# Patient Record
Sex: Male | Born: 1980 | Race: Black or African American | Hispanic: No | Marital: Single | Smoking: Never smoker
Health system: Southern US, Community
[De-identification: ages and names within clinical notes are randomized; demographics above are authoritative.]

## PROBLEM LIST (undated history)

## (undated) DIAGNOSIS — W3400XA Accidental discharge from unspecified firearms or gun, initial encounter: Secondary | ICD-10-CM

## (undated) HISTORY — PX: OTHER SURGICAL HISTORY: SHX169

## (undated) HISTORY — PX: ABDOMINAL SURGERY: SHX537

---

## 2015-03-22 ENCOUNTER — Emergency Department (HOSPITAL_BASED_OUTPATIENT_CLINIC_OR_DEPARTMENT_OTHER)
Admission: EM | Admit: 2015-03-22 | Discharge: 2015-03-22 | Disposition: A | Discharge status: Home / Self Care | Payer: Self-pay | Attending: Emergency Medicine | Admitting: Emergency Medicine

## 2015-03-22 ENCOUNTER — Emergency Department (HOSPITAL_BASED_OUTPATIENT_CLINIC_OR_DEPARTMENT_OTHER): Payer: Self-pay

## 2015-03-22 ENCOUNTER — Encounter (HOSPITAL_BASED_OUTPATIENT_CLINIC_OR_DEPARTMENT_OTHER): Payer: Self-pay

## 2015-03-22 DIAGNOSIS — Y998 Other external cause status: Secondary | ICD-10-CM | POA: Insufficient documentation

## 2015-03-22 DIAGNOSIS — X58XXXA Exposure to other specified factors, initial encounter: Secondary | ICD-10-CM | POA: Insufficient documentation

## 2015-03-22 DIAGNOSIS — M545 Low back pain, unspecified: Secondary | ICD-10-CM

## 2015-03-22 DIAGNOSIS — S3992XA Unspecified injury of lower back, initial encounter: Secondary | ICD-10-CM | POA: Insufficient documentation

## 2015-03-22 DIAGNOSIS — Y9389 Activity, other specified: Secondary | ICD-10-CM | POA: Insufficient documentation

## 2015-03-22 DIAGNOSIS — Y9289 Other specified places as the place of occurrence of the external cause: Secondary | ICD-10-CM | POA: Insufficient documentation

## 2015-03-22 HISTORY — DX: Accidental discharge from unspecified firearms or gun, initial encounter: W34.00XA

## 2015-03-22 MED ORDER — CYCLOBENZAPRINE HCL 10 MG PO TABS: 10.0000 mg | ORAL_TABLET | Freq: Three times a day (TID) | ORAL | Status: DC | PRN
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
## 2015-03-22 NOTE — ED Notes (Signed)
Patient transported to X-ray 

## 2015-03-22 NOTE — Discharge Instructions (Signed)

## 2016-04-13 ENCOUNTER — Emergency Department (HOSPITAL_BASED_OUTPATIENT_CLINIC_OR_DEPARTMENT_OTHER)
Admission: EM | Admit: 2016-04-13 | Discharge: 2016-04-13 | Disposition: A | Discharge status: Home / Self Care | Payer: Self-pay | Attending: Emergency Medicine | Admitting: Emergency Medicine

## 2016-04-13 ENCOUNTER — Encounter (HOSPITAL_BASED_OUTPATIENT_CLINIC_OR_DEPARTMENT_OTHER): Payer: Self-pay | Admitting: *Deleted

## 2016-04-13 DIAGNOSIS — J020 Streptococcal pharyngitis: Secondary | ICD-10-CM | POA: Insufficient documentation

## 2016-04-13 LAB — RAPID STREP SCREEN (MED CTR MEBANE ONLY): Streptococcus, Group A Screen (Direct): POSITIVE — AB

## 2016-04-13 MED ORDER — PENICILLIN G BENZATHINE 1200000 UNIT/2ML IM SUSP
1.2000 10*6.[IU] | Freq: Once | INTRAMUSCULAR | Status: AC
  Administered 2016-04-13: 1.2 10*6.[IU] via INTRAMUSCULAR
  Filled 2016-04-13: qty 2

## 2016-04-13 NOTE — Discharge Instructions (Signed)
Discard your toothbrush and begin using a new one. For sore throat, take ibuprofen every 6 hours as needed. Follow up with your doctor in 2-3 days if no improvement. Return to the ED sooner for worsening condition, inability to swallow, breathing difficulty, new concerns.

## 2016-04-13 NOTE — ED Triage Notes (Signed)
Pt reports earlier this week had a URI, states that his throat is hurting now.  Denies fever.
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
## 2018-08-09 ENCOUNTER — Other Ambulatory Visit: Payer: Self-pay

## 2018-08-09 ENCOUNTER — Emergency Department (HOSPITAL_BASED_OUTPATIENT_CLINIC_OR_DEPARTMENT_OTHER): Payer: Self-pay

## 2018-08-09 ENCOUNTER — Emergency Department (HOSPITAL_BASED_OUTPATIENT_CLINIC_OR_DEPARTMENT_OTHER)
Admission: EM | Admit: 2018-08-09 | Discharge: 2018-08-09 | Disposition: A | Discharge status: Home / Self Care | Payer: Self-pay | Attending: Emergency Medicine | Admitting: Emergency Medicine

## 2018-08-09 ENCOUNTER — Encounter (HOSPITAL_BASED_OUTPATIENT_CLINIC_OR_DEPARTMENT_OTHER): Payer: Self-pay | Admitting: Emergency Medicine

## 2018-08-09 DIAGNOSIS — W2209XA Striking against other stationary object, initial encounter: Secondary | ICD-10-CM | POA: Insufficient documentation

## 2018-08-09 DIAGNOSIS — S62396A Other fracture of fifth metacarpal bone, right hand, initial encounter for closed fracture: Secondary | ICD-10-CM | POA: Insufficient documentation

## 2018-08-09 DIAGNOSIS — R52 Pain, unspecified: Secondary | ICD-10-CM

## 2018-08-09 DIAGNOSIS — Y939 Activity, unspecified: Secondary | ICD-10-CM | POA: Insufficient documentation

## 2018-08-09 DIAGNOSIS — Y929 Unspecified place or not applicable: Secondary | ICD-10-CM | POA: Insufficient documentation

## 2018-08-09 DIAGNOSIS — Y999 Unspecified external cause status: Secondary | ICD-10-CM | POA: Insufficient documentation

## 2018-08-09 MED ORDER — LIDOCAINE HCL 2 % IJ SOLN
INTRAMUSCULAR | Status: AC
  Filled 2018-08-09: qty 20

## 2018-08-09 MED ORDER — HYDROCODONE-ACETAMINOPHEN 5-325 MG PO TABS
1.0000 | ORAL_TABLET | Freq: Once | ORAL | Status: AC
  Administered 2018-08-09: 1 via ORAL
  Filled 2018-08-09: qty 1

## 2018-08-09 MED ORDER — OXYCODONE HCL 5 MG PO TABS: 5.0000 mg | ORAL_TABLET | Freq: Four times a day (QID) | ORAL | 0 refills | Status: AC | PRN

## 2018-08-09 MED ORDER — LIDOCAINE HCL (PF) 1 % IJ SOLN
5.0000 mL | Freq: Once | INTRAMUSCULAR | Status: AC
  Administered 2018-08-09: 5 mL
  Filled 2018-08-09: qty 5
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
## 2018-08-09 NOTE — ED Notes (Signed)
Pt verbalized understanding of dc instructions.
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
## 2018-08-09 NOTE — ED Notes (Addendum)
ED Provider at bedside, EMT and XRAY at bedside for reduction. Pt alert

## 2018-08-18 ENCOUNTER — Ambulatory Visit (INDEPENDENT_AMBULATORY_CARE_PROVIDER_SITE_OTHER): Payer: Self-pay

## 2018-08-18 ENCOUNTER — Encounter: Payer: Self-pay | Admitting: Orthopaedic Surgery

## 2018-08-18 ENCOUNTER — Ambulatory Visit (INDEPENDENT_AMBULATORY_CARE_PROVIDER_SITE_OTHER): Payer: Self-pay | Admitting: Orthopaedic Surgery

## 2018-08-18 DIAGNOSIS — S62336A Displaced fracture of neck of fifth metacarpal bone, right hand, initial encounter for closed fracture: Secondary | ICD-10-CM
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
## 2018-09-08 ENCOUNTER — Ambulatory Visit (INDEPENDENT_AMBULATORY_CARE_PROVIDER_SITE_OTHER): Payer: Self-pay | Admitting: Orthopaedic Surgery

## 2018-09-08 ENCOUNTER — Other Ambulatory Visit: Payer: Self-pay

## 2018-09-08 ENCOUNTER — Encounter: Payer: Self-pay | Admitting: Orthopaedic Surgery

## 2018-09-08 ENCOUNTER — Ambulatory Visit (INDEPENDENT_AMBULATORY_CARE_PROVIDER_SITE_OTHER): Payer: Self-pay

## 2018-09-08 VITALS — Ht 72.0 in | Wt 264.0 lb

## 2018-09-08 DIAGNOSIS — S62336D Displaced fracture of neck of fifth metacarpal bone, right hand, subsequent encounter for fracture with routine healing: Secondary | ICD-10-CM
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
## 2019-12-26 IMAGING — CR RIGHT HAND - COMPLETE 3+ VIEW
3 series · 3 of 3 positions shown · non-contrast
Comparison: None.

CLINICAL DATA: Punched a wall yesterday with medial hand pain,
initial encounter

EXAM:
RIGHT HAND - COMPLETE 3+ VIEW

[x hand pa right]
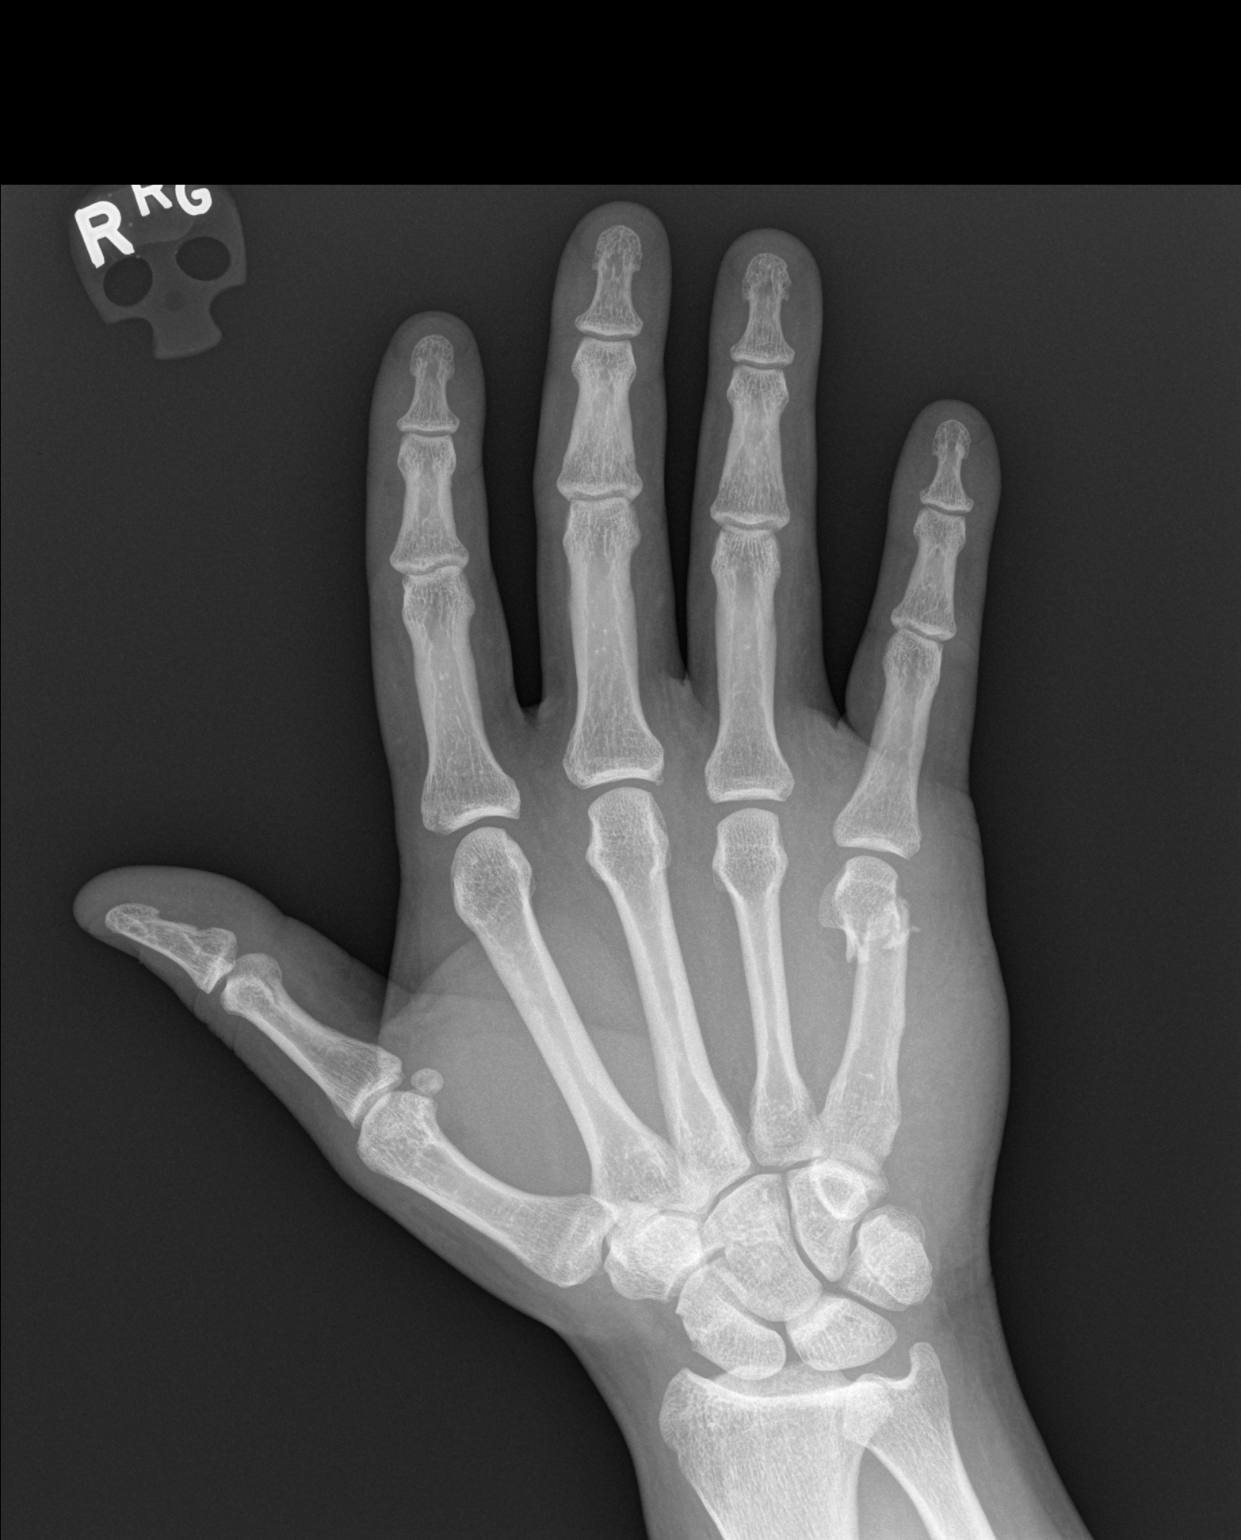

[x hand oblique right]
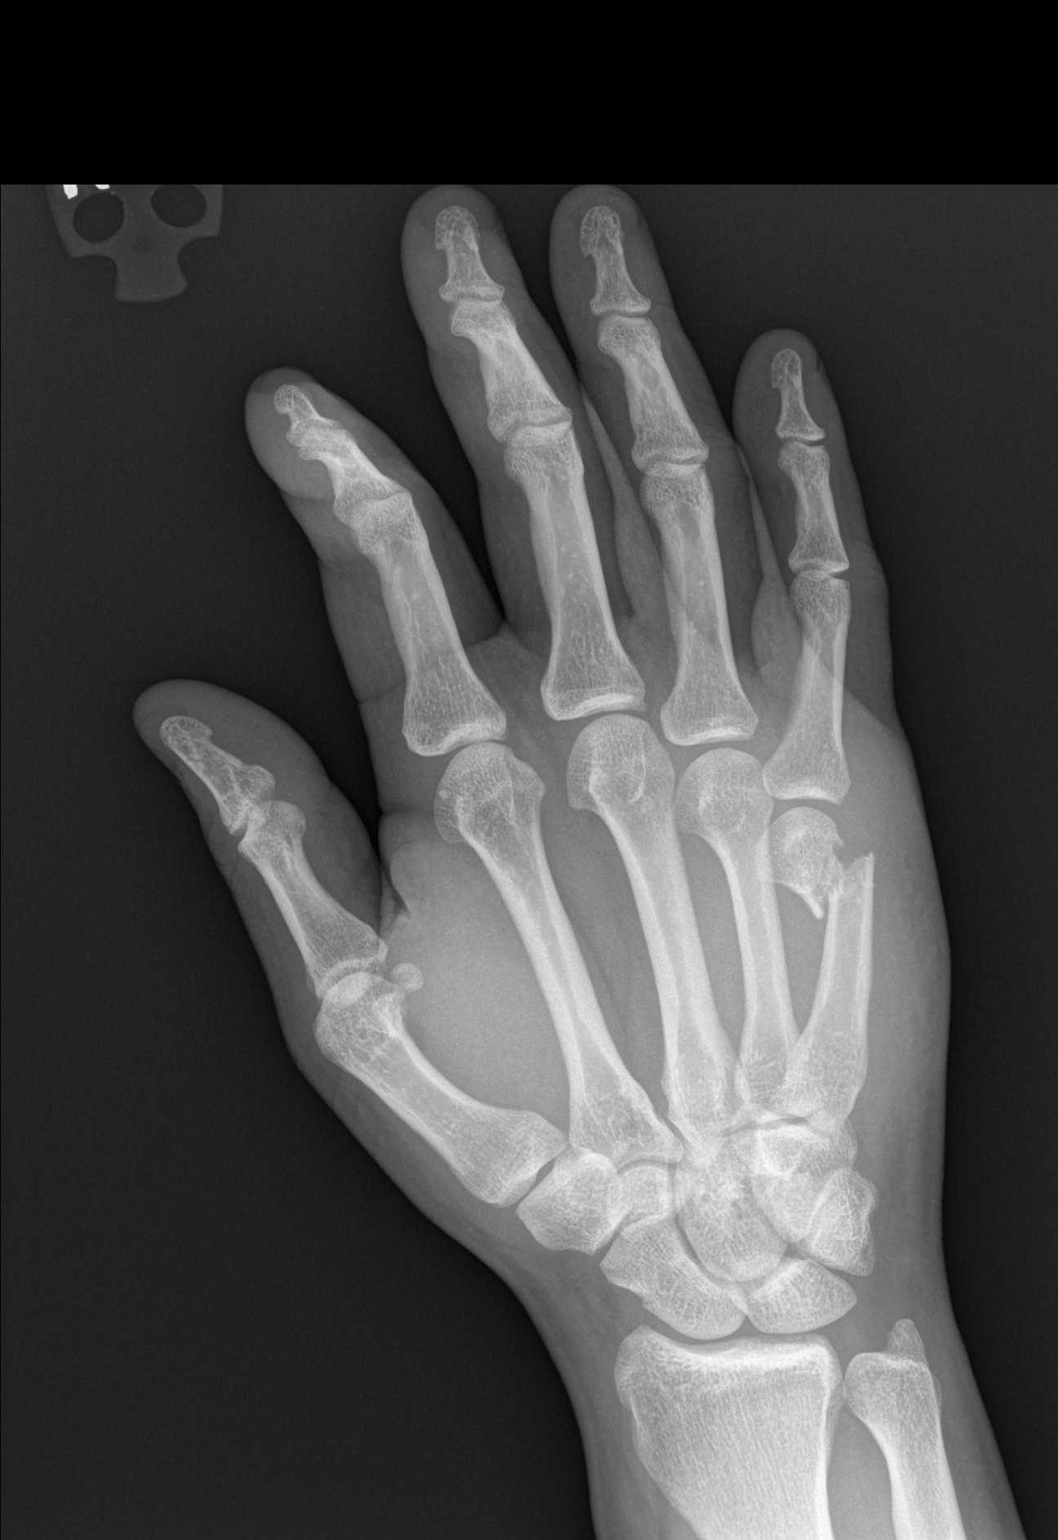

[x hand lat right]
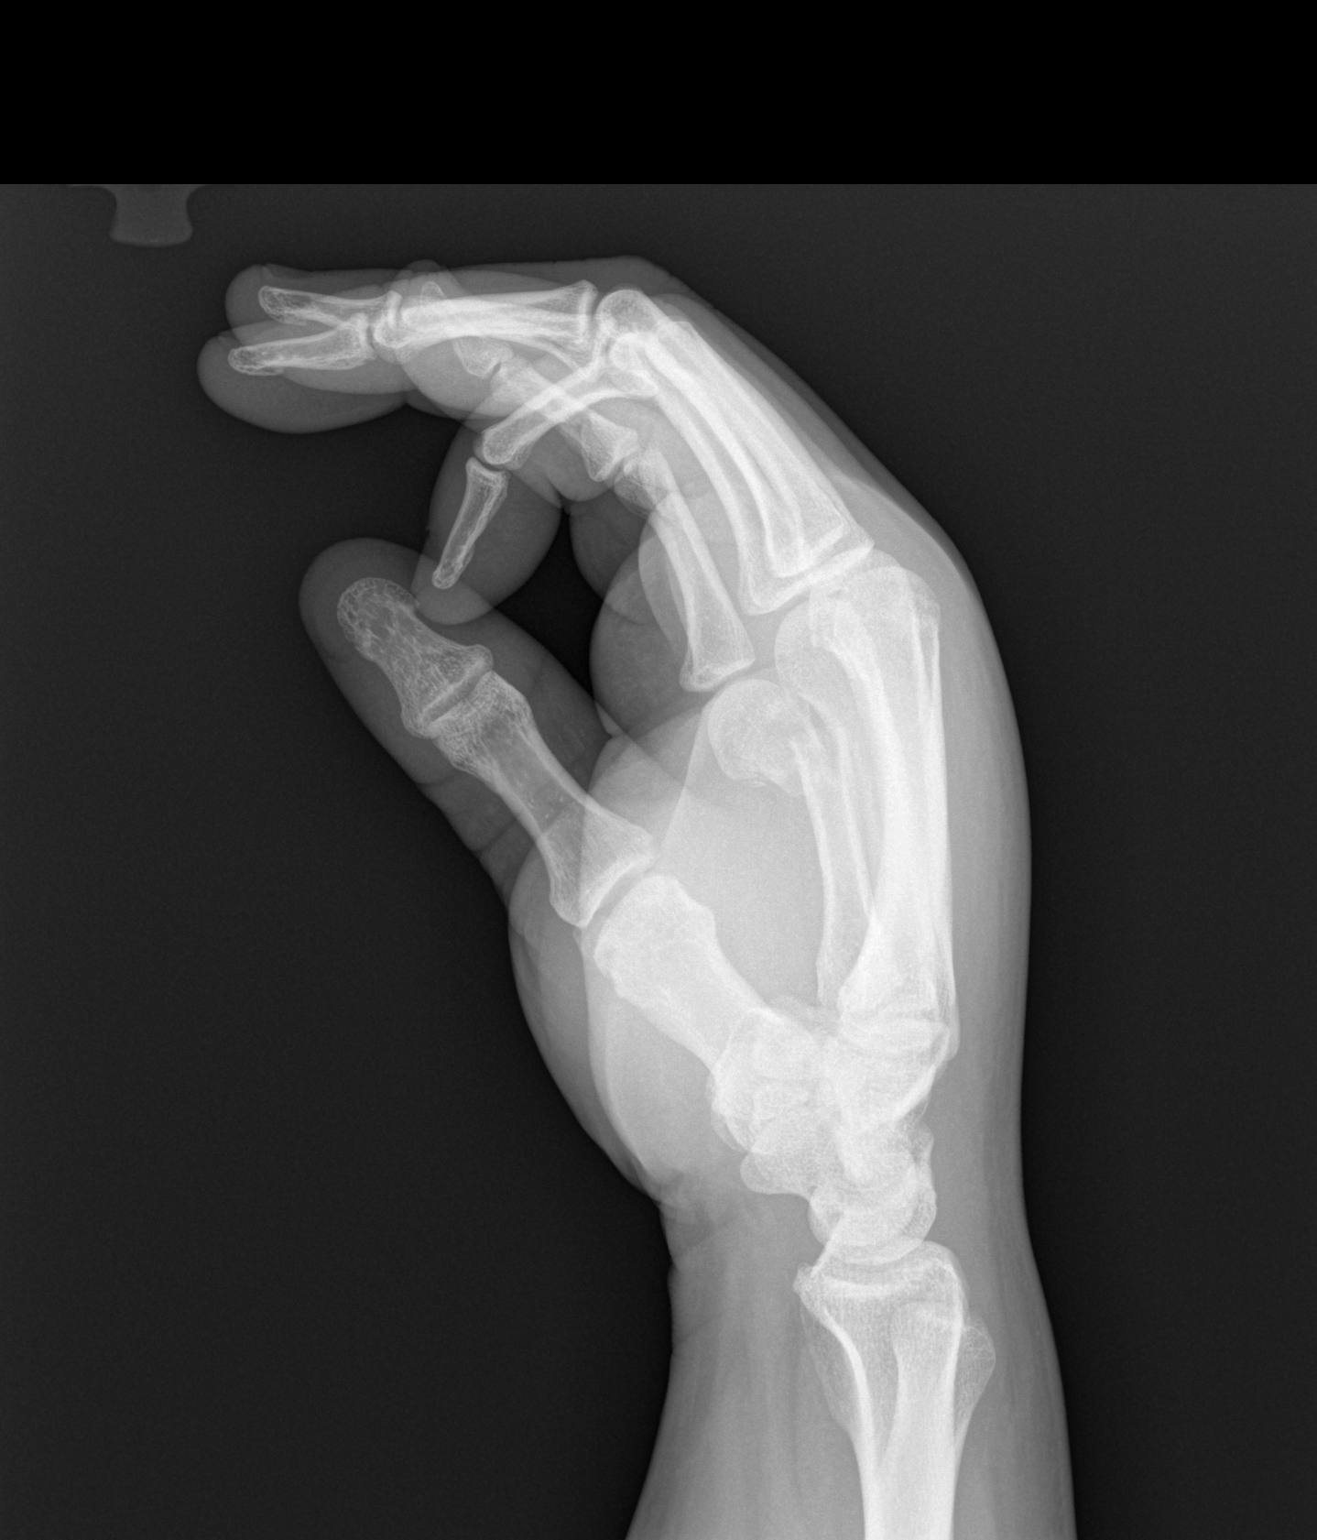

[3 of 3 positions shown; findings below may reference images not displayed]

FINDINGS: Comminuted fracture of the distal fifth metacarpal is noted with
angulation at the fracture site. Mild impaction is noted as well. No
other fractures are seen.
IMPRESSION: Fracture of the distal fifth metacarpal.

## 2021-02-14 ENCOUNTER — Other Ambulatory Visit: Payer: Self-pay

## 2021-02-14 ENCOUNTER — Encounter (HOSPITAL_BASED_OUTPATIENT_CLINIC_OR_DEPARTMENT_OTHER): Payer: Self-pay | Admitting: Urology

## 2021-02-14 ENCOUNTER — Emergency Department (HOSPITAL_BASED_OUTPATIENT_CLINIC_OR_DEPARTMENT_OTHER)
Admission: EM | Admit: 2021-02-14 | Discharge: 2021-02-14 | Disposition: A | Discharge status: Home / Self Care | Payer: Self-pay | Attending: Emergency Medicine | Admitting: Emergency Medicine

## 2021-02-14 DIAGNOSIS — K0889 Other specified disorders of teeth and supporting structures: Secondary | ICD-10-CM

## 2021-02-14 DIAGNOSIS — K047 Periapical abscess without sinus: Secondary | ICD-10-CM | POA: Insufficient documentation

## 2021-02-14 MED ORDER — KETOROLAC TROMETHAMINE 30 MG/ML IJ SOLN
60.0000 mg | Freq: Once | INTRAMUSCULAR | Status: AC
  Administered 2021-02-14: 60 mg via INTRAMUSCULAR
  Filled 2021-02-14: qty 2

## 2021-02-14 MED ORDER — HYDROCODONE-ACETAMINOPHEN 5-325 MG PO TABS
2.0000 | ORAL_TABLET | Freq: Once | ORAL | Status: AC
  Administered 2021-02-14: 2 via ORAL
  Filled 2021-02-14: qty 2

## 2021-02-14 MED ORDER — AMOXICILLIN-POT CLAVULANATE 875-125 MG PO TABS: 1.0000 | ORAL_TABLET | Freq: Two times a day (BID) | ORAL | 0 refills | Status: AC

## 2021-02-14 MED ORDER — HYDROCODONE-ACETAMINOPHEN 5-325 MG PO TABS: 1.0000 | ORAL_TABLET | Freq: Four times a day (QID) | ORAL | 0 refills | Status: AC | PRN

## 2021-02-14 NOTE — ED Provider Notes (Signed)
[facility] EMERGENCY DEPARTMENT Provider Note   CSN: [id number] Arrival date & time: 02/14/21  1653     History  Chief Complaint  Patient presents with   Dental Pain    Johnathan Nash is a 41 y.o. male presenting with 2 days of left dental pain and swelling.  Patient reports that he has never seen a dentist.  Denies any bleeding, abscess or discharge.  States that the pain is worse with chewing.  No difficulty swallowing or breathing.  Denies any fevers or chills.     Home Medications Prior to Admission medications   Medication Sig Start Date End Date Taking? Authorizing Provider  amoxicillin-clavulanate (AUGMENTIN) 875-125 MG tablet Take 1 tablet by mouth every 12 (twelve) hours. 02/14/21  Yes Carolynne Schuchard A, PA-C  HYDROcodone-acetaminophen (NORCO/VICODIN) 5-325 MG tablet Take 1 tablet by mouth every 6 (six) hours as needed for up to 3 days. 02/14/21 02/17/21 Yes Tyke Outman A, PA-C  oxyCODONE (ROXICODONE) 5 MG immediate release tablet Take 1 tablet (5 mg total) by mouth every 6 (six) hours as needed for up to 15 doses for severe pain. 08/09/18   Virgina Norfolk, DO      Allergies    Patient has no known allergies.    Review of Systems   Review of Systems  Physical Exam Updated Vital Signs BP (!) 161/104 (BP Location: Left Arm)    Pulse 77    Temp 98.6 F (37 C) (Oral)    Resp 18    Ht 6' (1.829 m)    Wt 113.4 kg    SpO2 98%    BMI 33.91 kg/m  Physical Exam Vitals and nursing note reviewed.  Constitutional:      Appearance: Normal appearance.  HENT:     Head: Normocephalic and atraumatic.     Mouth/Throat:     Mouth: Mucous membranes are moist.     Pharynx: Oropharynx is clear.     Comments: No signs of abscess.  Large amounts of swelling and erythema noted to the left side of the mouth.  Multiple infected teeth throughout the patient's mouth however worst in the left lower molars. Eyes:     General: No scleral icterus.    Conjunctiva/sclera:  Conjunctivae normal.  Pulmonary:     Effort: Pulmonary effort is normal. No respiratory distress.  Skin:    Findings: No rash.  Neurological:     Mental Status: He is alert.  Psychiatric:        Mood and Affect: Mood normal.    ED Results / Procedures / Treatments   Labs (all labs ordered are listed, but only abnormal results are displayed) Labs Reviewed - No data to display  EKG None  Radiology No results found.  Procedures Procedures    Medications Ordered in ED Medications  HYDROcodone-acetaminophen (NORCO/VICODIN) 5-325 MG per tablet 2 tablet (2 tablets Oral Given 02/14/21 1819)  ketorolac (TORADOL) 30 MG/ML injection 60 mg (60 mg Intramuscular Given 02/14/21 1820)    ED Course/ Medical Decision Making/ A&P                           Medical Decision Making Risk Prescription drug management.   41 year old male presenting with dental pain.  Per physical exam, this pain is likely secondary to dental infection.  He has not seen a dentist and does not have health insurance however he was referred to 1 and will call in the morning.  Also  given dental resources from our department today.  Given a Vicodin and Toradol for pain control.  Discharged with Augmentin and Vicodin.  Final Clinical Impression(s) / ED Diagnoses Final diagnoses:  Pain, dental    Rx / DC Orders ED Discharge Orders          Ordered    amoxicillin-clavulanate (AUGMENTIN) 875-125 MG tablet  Every 12 hours        02/14/21 1809    HYDROcodone-acetaminophen (NORCO/VICODIN) 5-325 MG tablet  Every 6 hours PRN        02/14/21 1809           Results and diagnoses were explained to the patient. Return precautions discussed in full. Patient had no additional questions and expressed complete understanding.   This chart was dictated using voice recognition software.  Despite best efforts to proofread,  errors can occur which can change the documentation meaning.    Woodroe Chen 02/14/21  1910    Charlynne Pander, MD 02/14/21 1911

## 2021-02-14 NOTE — Discharge Instructions (Addendum)
It is very important for you to take these antibiotics and follow-up with a dentist.  Began calling offices in the morning.  You may take ibuprofen in addition to the Vicodin pain medication however do not take Tylenol with this because there is already Tylenol mixed in.  I have also referred you to a primary care provider.  Please make an appointment with them to take care of your overall health.

## 2021-02-14 NOTE — ED Triage Notes (Signed)
Dental pain left lower x 3 days No dentist established NAD now, A& O x 4
